# Patient Record
Sex: Female | Born: 2000 | Race: White | Hispanic: No | Marital: Single | Smoking: Never smoker
Health system: Southern US, Community
[De-identification: ages and names within clinical notes are randomized; demographics above are authoritative.]

## PROBLEM LIST (undated history)

## (undated) DIAGNOSIS — M419 Scoliosis, unspecified: Secondary | ICD-10-CM

## (undated) DIAGNOSIS — G8929 Other chronic pain: Secondary | ICD-10-CM

## (undated) DIAGNOSIS — M549 Dorsalgia, unspecified: Secondary | ICD-10-CM

## (undated) DIAGNOSIS — E119 Type 2 diabetes mellitus without complications: Secondary | ICD-10-CM

---
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
## 2019-11-05 IMAGING — US US ABDOMEN LIMITED
1 series · 14 of 25 positions shown · non-contrast
Comparison: None.

CLINICAL DATA: Right upper quadrant pain, vomiting, and nausea.

EXAM:
ULTRASOUND ABDOMEN LIMITED RIGHT UPPER QUADRANT

[Series 1: us abdomen limited · 0.22mm/px · 14 of 52 slices shown]
[im 1/52]
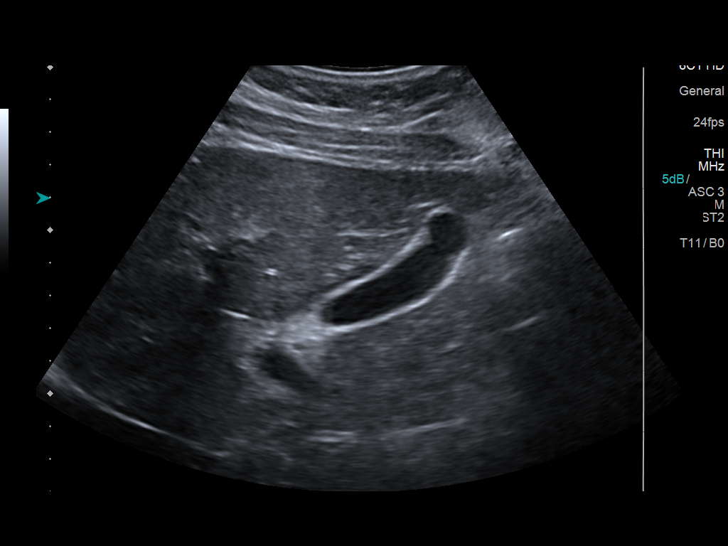
[im 5/52]
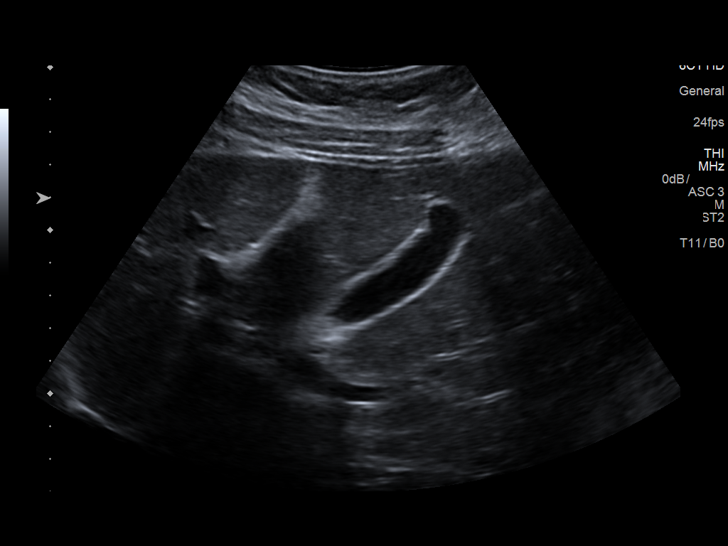
[im 9/52]
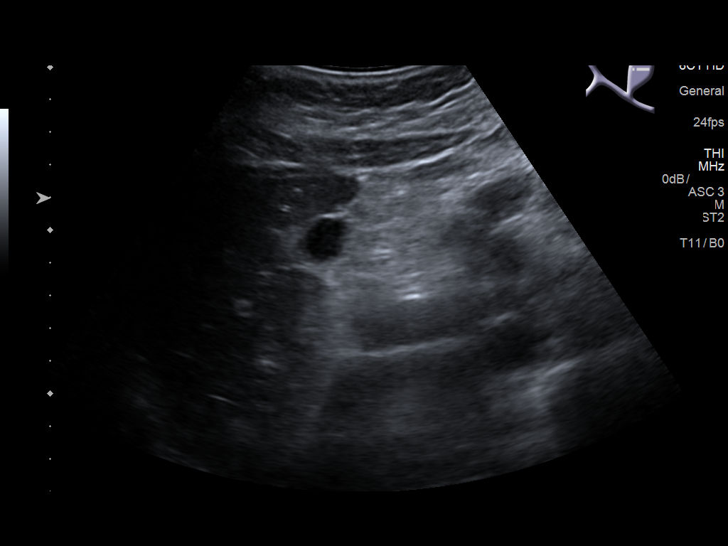
[im 13/52]
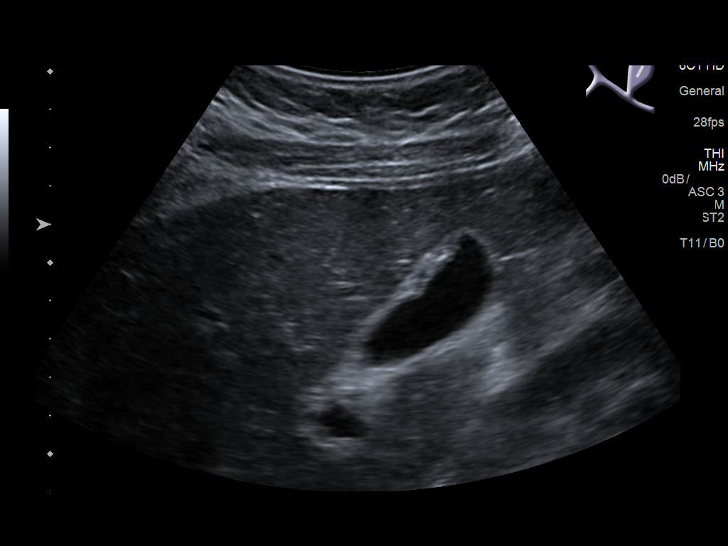
[im 18/52]
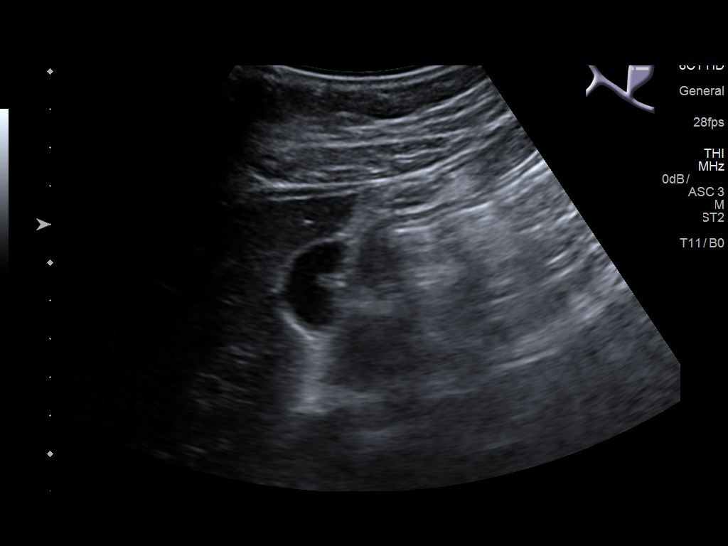
[im 20/52]
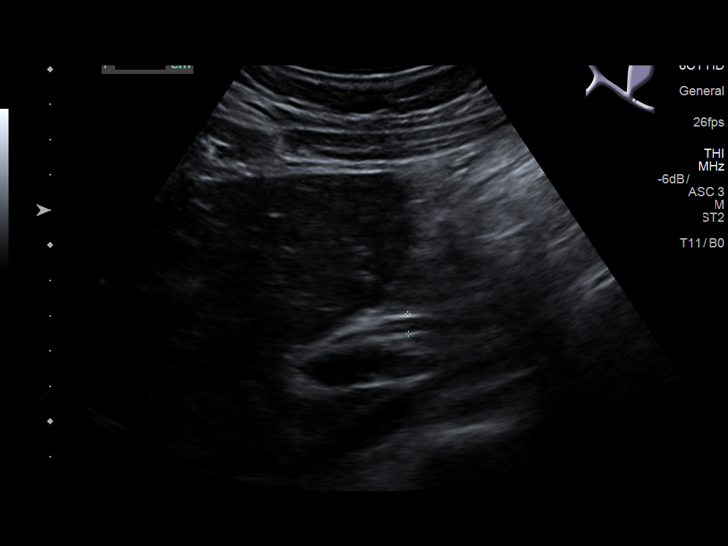
[im 24/52]
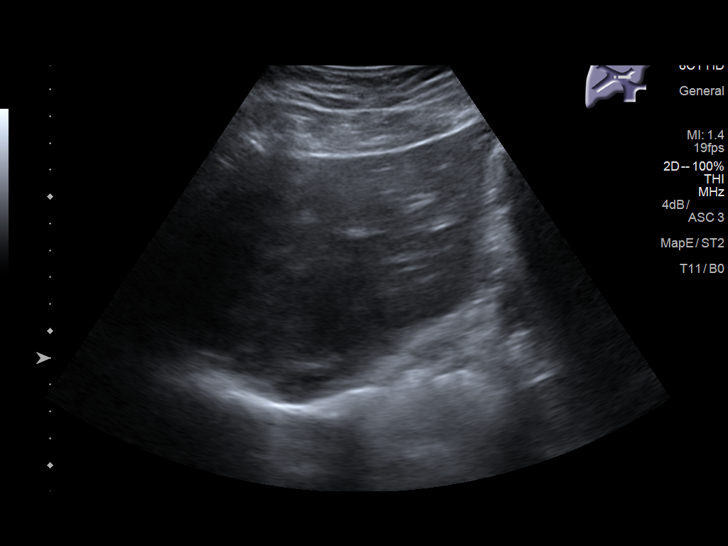
[im 28/52]
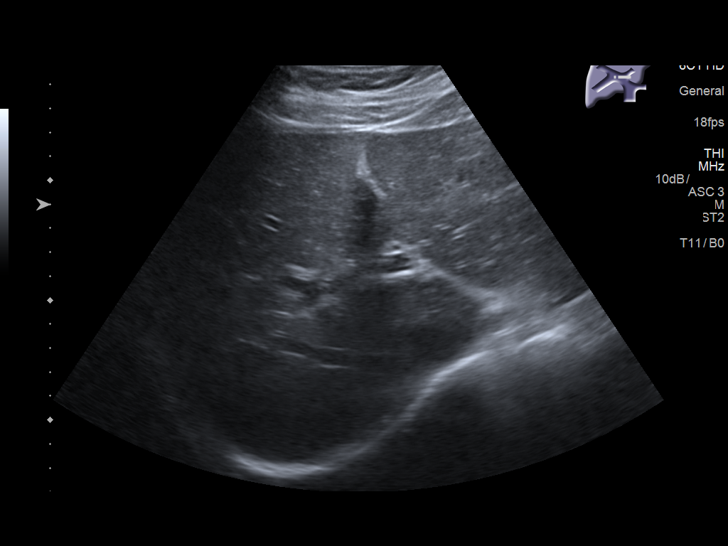
[im 32/52]
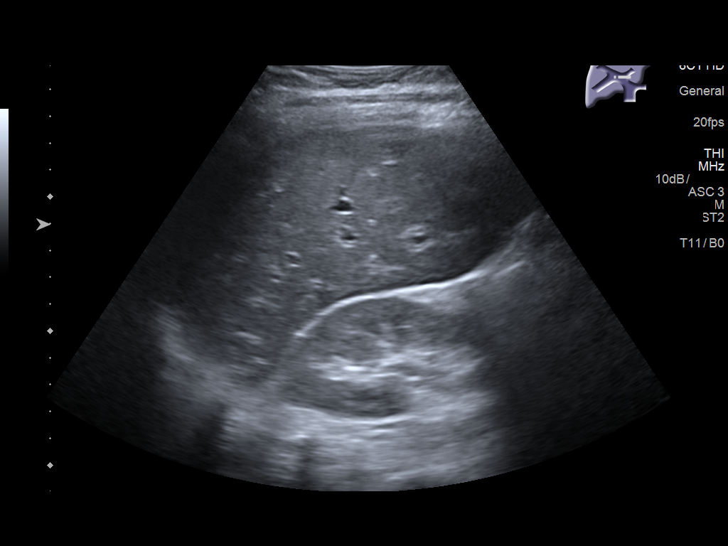
[im 35/52]
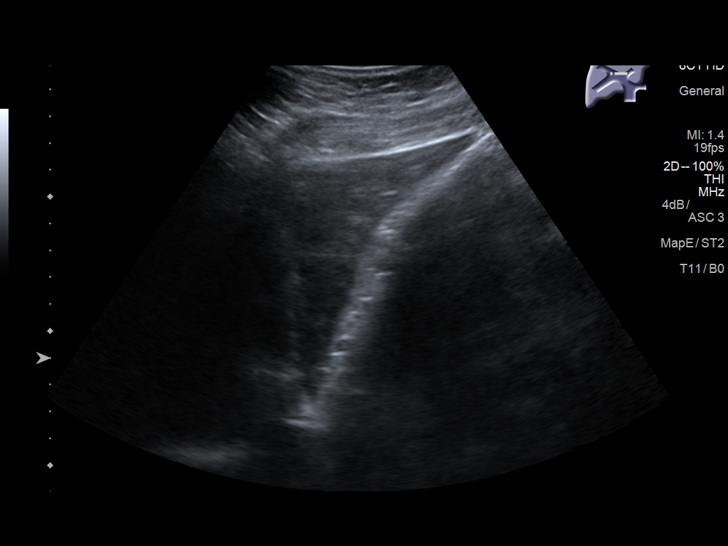
[im 39/52]
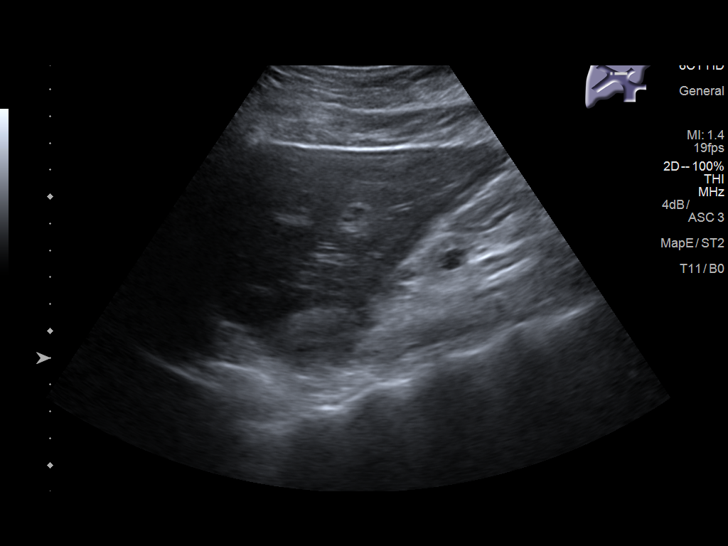
[im 43/52]
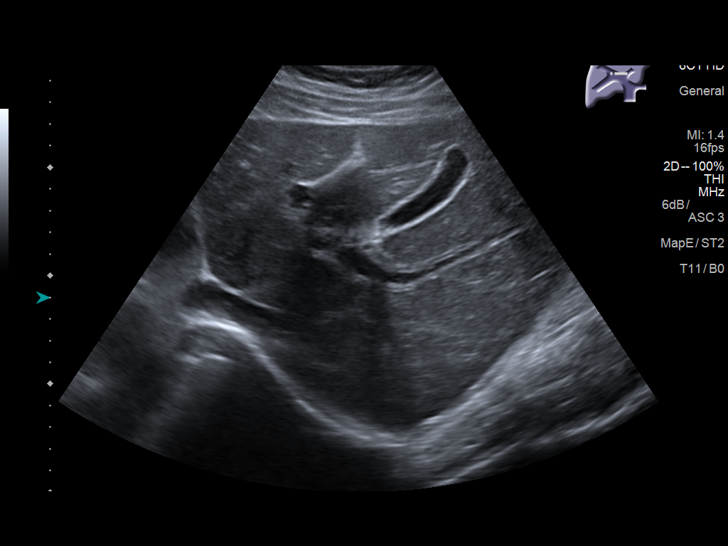
[im 47/52]
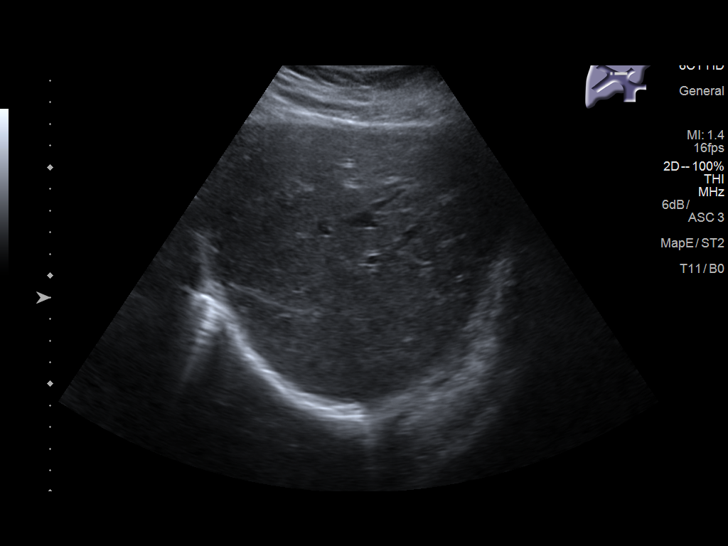
[im 52/52]
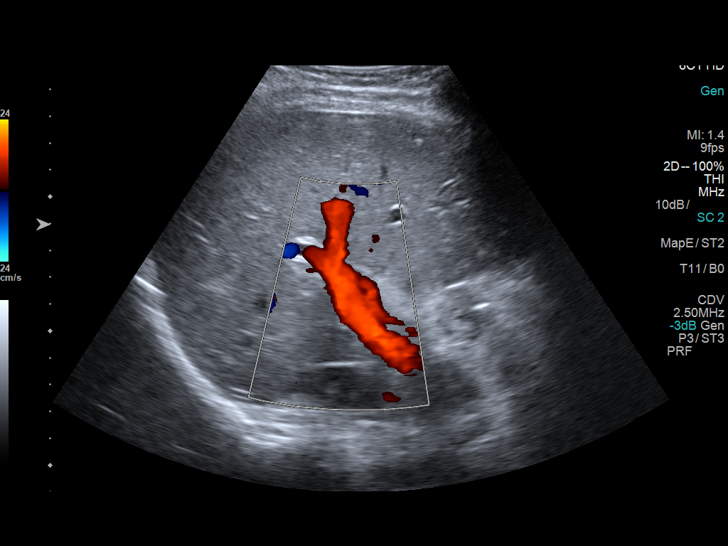

[14 of 25 positions shown; findings below may reference images not displayed]

FINDINGS: Gallbladder:

No gallstones. Gallbladder wall is mildly thickened. This is likely
due to partial contraction. This represents normal variation if the
patient is nonfasting. Murphy's sign is negative.

Common bile duct:

Diameter: 5.7 mm, normal

Liver:

No focal lesion identified. Within normal limits in parenchymal
echogenicity. Portal vein is patent on color Doppler imaging with
normal direction of blood flow towards the liver.
IMPRESSION: Partially contracted gallbladder likely accounts for mild
gallbladder wall thickening. No gallstones. Negative Murphy's sign.

## 2019-12-13 ENCOUNTER — Ambulatory Visit: Payer: Commercial Managed Care - PPO | Admitting: Endocrinology

## 2019-12-13 ENCOUNTER — Other Ambulatory Visit: Payer: Self-pay

## 2019-12-13 ENCOUNTER — Other Ambulatory Visit: Payer: Self-pay | Admitting: Endocrinology

## 2019-12-13 ENCOUNTER — Encounter: Payer: Self-pay | Admitting: Endocrinology

## 2019-12-13 VITALS — BP 115/70 | HR 72 | Ht 62.0 in | Wt 114.2 lb

## 2019-12-13 DIAGNOSIS — E10649 Type 1 diabetes mellitus with hypoglycemia without coma: Secondary | ICD-10-CM

## 2019-12-13 DIAGNOSIS — E1369 Other specified diabetes mellitus with other specified complication: Secondary | ICD-10-CM

## 2019-12-13 DIAGNOSIS — E109 Type 1 diabetes mellitus without complications: Secondary | ICD-10-CM

## 2019-12-13 LAB — POCT GLYCOSYLATED HEMOGLOBIN (HGB A1C): Hemoglobin A1C: 6.9 % — AB (ref 4.0–5.6)

## 2019-12-13 MED ORDER — INSULIN LISPRO 100 UNIT/ML ~~LOC~~ SOLN: SUBCUTANEOUS | 3 refills | Status: DC

## 2019-12-13 NOTE — Progress Notes (Addendum)
Subjective:    Patient ID: Janet Little, female    DOB: 11-25-2000, 19 y.o.   MRN: [id number]  HPI pt is referred by Dr Maurice March, for diabetes.  Pt states DM was dx'ed in 2015; she is unaware of any chronic complications; he has been on insulin since dx, and pump rx since 2017; pt says his diet and exercise are good; she has never had GDM, pancreatitis, pancreatic surgery, severe hypoglycemia or DKA (but she had nonketotic hyperosmolar hyperglycemic state at dx).  She has mild hypoglycemia approx 3-4 times per week.  This happens when she has decreased appetite.  She uses Omnipods (not "Dash") and Dexcom G6.   She takes these settings: basal rates: 0.65 units/HR 08:30  0.8 units/HR 5PM 1 unit/HR bolus of 1 unit/8grams carbohydrate correction bolus (which some people call "sensitivity," or "insulin sensitivity ratio," or just "isr") of 1 unit for each 45 by which your glucose exceeds 150.   I reviewed continuous glucose monitor data.  Glucoses vary widely.  Glucose varies from 60-300.  It is in general highest at 1PM.  It generally decreases overnight.  She takes 1.4 boluses per day.  TDD is 20 units (90% basal).   Past Medical History:  Diagnosis Date  . Chronic back pain   . Diabetes mellitus without complication (HCC)   . Scoliosis     No past surgical history on file.  Social History   Socioeconomic History  . Marital status: Single    Spouse name: Not on file  . Number of children: Not on file  . Years of education: Not on file  . Highest education level: Not on file  Occupational History  . Not on file  Tobacco Use  . Smoking status: Never Smoker  . Smokeless tobacco: Never Used  Substance and Sexual Activity  . Alcohol use: Yes    Comment: 7 drinks on Friday and 7 on Saturdays   . Drug use: Yes    Types: Marijuana  . Sexual activity: Not on file  Other Topics Concern  . Not on file  Social History Narrative  . Not on file   Social Determinants of  Health   Financial Resource Strain:   . Difficulty of Paying Living Expenses: Not on file  Food Insecurity:   . Worried About Programme researcher, broadcasting/film/video in the Last Year: Not on file  . Ran Out of Food in the Last Year: Not on file  Transportation Needs:   . Lack of Transportation (Medical): Not on file  . Lack of Transportation (Non-Medical): Not on file  Physical Activity:   . Days of Exercise per Week: Not on file  . Minutes of Exercise per Session: Not on file  Stress:   . Feeling of Stress : Not on file  Social Connections:   . Frequency of Communication with Friends and Family: Not on file  . Frequency of Social Gatherings with Friends and Family: Not on file  . Attends Religious Services: Not on file  . Active Member of Clubs or Organizations: Not on file  . Attends Banker Meetings: Not on file  . Marital Status: Not on file  Intimate Partner Violence:   . Fear of Current or Ex-Partner: Not on file  . Emotionally Abused: Not on file  . Physically Abused: Not on file  . Sexually Abused: Not on file    Current Outpatient Medications on File Prior to Visit  Medication Sig Dispense Refill  .  fluticasone (FLONASE) 50 MCG/ACT nasal spray Place 2 sprays into both nostrils daily. For allergies, nasal congestion 16 g 2  . loratadine (CLARITIN) 10 MG tablet Take 1 tablet (10 mg total) by mouth daily. In mornings for allergies 30 tablet 1  . pantoprazole (PROTONIX) 40 MG tablet Take 1 tablet (40 mg total) by mouth daily. (Patient not taking: Reported on 12/13/2019) 30 tablet 2  . SUMAtriptan (IMITREX) 50 MG tablet Take 1 tablet (50 mg total) by mouth once as needed for migraine. May repeat in 2 hours if headache persists or recurs. 30 tablet 0   No current facility-administered medications on file prior to visit.    No Known Allergies  Family History  Problem Relation Age of Onset  . Diabetes Neg Hx     BP 115/70   Pulse 72   Ht 5\' 2"  (1.575 m)   Wt 114 lb 3.2 oz  (51.8 kg)   SpO2 99%   BMI 20.89 kg/m    Review of Systems denies weight loss, blurry vision, sob, n/v, and urinary frequency.  depression is improved recently.       Objective:   Physical Exam VITAL SIGNS:  See vs page GENERAL: no distress Pulses: dorsalis pedis intact bilat.   MSK: no deformity of the feet CV: no leg edema Skin:  no ulcer on the feet.  normal color and temp on the feet. Neuro: sensation is intact to touch on the feet  Lab Results  Component Value Date   HGBA1C 6.9 (A) 12/13/2019     I have reviewed outside records, and summarized: Pt was noted to have DM, and referred here.  Pt reported n/v, and various causes were considered.     Assessment & Plan:  Type 1 DM, new to me Hypoglycemia, due to insulin: this limits aggressiveness of glycemic control   Patient Instructions  good diet and exercise significantly improve the control of your diabetes.  please let me know if you wish to be referred to a dietician.  high blood sugar is very risky to your health.  you should see an eye doctor and dentist every year.  It is very important to get all recommended vaccinations.  Controlling your blood pressure and cholesterol drastically reduces the damage diabetes does to your body.  Those who smoke should quit.  Please discuss these with your doctor.  Please take these boluses: MN: 0.5 units/HR 9AM: 0.8 units/HR 5PM: 1.1 units/HR Please come back for a follow-up appointment in 3 months.

## 2019-12-13 NOTE — Patient Instructions (Addendum)
good diet and exercise significantly improve the control of your diabetes.  please let me know if you wish to be referred to a dietician.  high blood sugar is very risky to your health.  you should see an eye doctor and dentist every year.  It is very important to get all recommended vaccinations.  Controlling your blood pressure and cholesterol drastically reduces the damage diabetes does to your body.  Those who smoke should quit.  Please discuss these with your doctor.  Please take these boluses: MN: 0.5 units/HR 9AM: 0.8 units/HR 5PM: 1.1 units/HR Please come back for a follow-up appointment in 3 months.

## 2019-12-14 NOTE — Telephone Encounter (Signed)
Please advise 

## 2019-12-19 ENCOUNTER — Telehealth: Payer: Self-pay

## 2019-12-19 NOTE — Telephone Encounter (Signed)
New message     Pt c/o medication issue:  1. Name of Medication: insulin lispro (HUMALOG) 100 UNIT/ML injection  2. What is your medication issue? Insurance will not cover medication - please advise     Asking for a call back to discuss

## 2019-12-20 NOTE — Telephone Encounter (Signed)
Please advise 

## 2019-12-20 NOTE — Telephone Encounter (Signed)
novolog is OK same dosage

## 2019-12-21 ENCOUNTER — Other Ambulatory Visit: Payer: Self-pay

## 2019-12-21 DIAGNOSIS — E1369 Other specified diabetes mellitus with other specified complication: Secondary | ICD-10-CM

## 2019-12-21 MED ORDER — INSULIN ASPART 100 UNIT/ML ~~LOC~~ SOLN: SUBCUTANEOUS | 99 refills | Status: AC

## 2019-12-21 NOTE — Telephone Encounter (Signed)
Rx for NovoLog 100 units/ML sent to patients preferred pharmacy.

## 2020-01-09 IMAGING — NM NM HEPATOBILIARY IMAGE, INC GB
2 series · 12 of 12 positions shown · non-contrast
Comparison: None.

CLINICAL DATA: Evaluate for cholecystitis.

EXAM:
NUCLEAR MEDICINE HEPATOBILIARY IMAGING
TECHNIQUE: Sequential images of the abdomen were obtained [DATE] minutes
following intravenous administration of radiopharmaceutical.
RADIOPHARMACEUTICALS:  4.65 mCi Fc-99m  Choletec IV

[he hepatobiliary · 3.10mm/px · 6 of 13 frames shown (1 of 2)]
[frame 2/13]
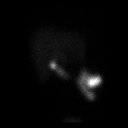
[frame 4/13]
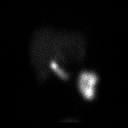
[frame 6/13]
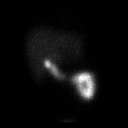
[frame 8/13]
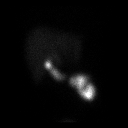
[frame 10/13]
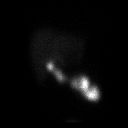
[frame 12/13]
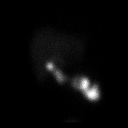

[he hepatobiliary · 3.10mm/px · 6 of 60 frames shown (2 of 2)]
[frame 6/60]
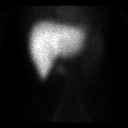
[frame 16/60]
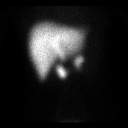
[frame 26/60]
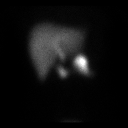
[frame 36/60]
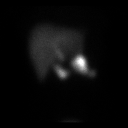
[frame 46/60]
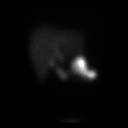
[frame 56/60]
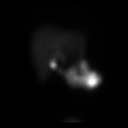

[12 of 12 positions shown; findings below may reference images not displayed]

FINDINGS: Prompt uptake and biliary excretion of activity by the liver is
seen. Gallbladder activity is visualized, consistent with patency of
cystic duct. Biliary activity passes into small bowel, consistent
with patent common bile duct.
IMPRESSION: Normal gallbladder filling.  No evidence of cholecystitis.

## 2020-03-15 ENCOUNTER — Ambulatory Visit: Payer: Commercial Managed Care - PPO | Admitting: Endocrinology

## 2020-03-22 ENCOUNTER — Other Ambulatory Visit: Payer: Self-pay

## 2020-03-26 ENCOUNTER — Other Ambulatory Visit: Payer: Self-pay

## 2020-03-26 ENCOUNTER — Ambulatory Visit: Payer: Commercial Managed Care - PPO | Admitting: Endocrinology

## 2020-03-26 ENCOUNTER — Encounter: Payer: Self-pay | Admitting: Endocrinology

## 2020-03-26 VITALS — BP 90/60 | HR 54 | Ht 62.0 in | Wt 113.8 lb

## 2020-03-26 DIAGNOSIS — E109 Type 1 diabetes mellitus without complications: Secondary | ICD-10-CM | POA: Diagnosis not present

## 2020-03-26 LAB — CBC WITH DIFFERENTIAL/PLATELET
Basophils Absolute: 0 10*3/uL (ref 0.0–0.1)
Basophils Relative: 0.5 % (ref 0.0–3.0)
Eosinophils Absolute: 0.1 10*3/uL (ref 0.0–0.7)
Eosinophils Relative: 1.2 % (ref 0.0–5.0)
HCT: 38.8 % (ref 36.0–46.0)
Hemoglobin: 13.1 g/dL (ref 12.0–15.0)
Lymphocytes Relative: 38 % (ref 12.0–46.0)
Lymphs Abs: 2.7 10*3/uL (ref 0.7–4.0)
MCHC: 33.6 g/dL (ref 30.0–36.0)
MCV: 88.4 fl (ref 78.0–100.0)
Monocytes Absolute: 0.6 10*3/uL (ref 0.1–1.0)
Monocytes Relative: 8.5 % (ref 3.0–12.0)
Neutro Abs: 3.7 10*3/uL (ref 1.4–7.7)
Neutrophils Relative %: 51.8 % (ref 43.0–77.0)
Platelets: 218 10*3/uL (ref 150.0–400.0)
RBC: 4.4 Mil/uL (ref 3.87–5.11)
RDW: 13.3 % (ref 11.5–15.5)
WBC: 7.1 10*3/uL (ref 4.0–10.5)

## 2020-03-26 LAB — BASIC METABOLIC PANEL
BUN: 9 mg/dL (ref 6–23)
CO2: 27 mEq/L (ref 19–32)
Calcium: 10.2 mg/dL (ref 8.4–10.5)
Chloride: 104 mEq/L (ref 96–112)
Creatinine, Ser: 0.69 mg/dL (ref 0.40–1.20)
GFR: 123.79 mL/min (ref >60.00)
Glucose, Bld: 57 mg/dL — ABNORMAL LOW (ref 70–99)
Potassium: 4.2 mEq/L (ref 3.5–5.1)
Sodium: 137 mEq/L (ref 135–145)

## 2020-03-26 LAB — POCT GLYCOSYLATED HEMOGLOBIN (HGB A1C): Hemoglobin A1C: 6.9 % — AB (ref 4.0–5.6)

## 2020-03-26 LAB — LIPID PANEL
Cholesterol: 125 mg/dL (ref 0–200)
HDL: 55.3 mg/dL (ref >39.00)
LDL Cholesterol: 62 mg/dL (ref 0–99)
NonHDL: 69.48
Total CHOL/HDL Ratio: 2
Triglycerides: 36 mg/dL (ref 0.0–149.0)
VLDL: 7.2 mg/dL (ref 0.0–40.0)

## 2020-03-26 LAB — TSH: TSH: 1.83 u[IU]/mL (ref 0.35–4.50)

## 2020-03-26 NOTE — Progress Notes (Signed)
Subjective:    Patient ID: Janet Little, female    DOB: 2000/11/19, 20 y.o.   MRN: [id number]  HPI Pt returns for f/u of diabetes mellitus: DM type: 1 Dx'ed: 2015 Complications: none Therapy: insulin since dx GDM: never (but she had nonketotic hyperosmolar hyperglycemic state at dx) DKA: never Severe hypoglycemia: never Pancreatitis: never Pancreatic imaging (2019 CT): Subtle hazy opacity is seen adjacent to the pancreatic head and descending duodenum. This could be due to mild duodenitis or focal pancreatitis. SDOH: none Other: She uses Omnipods (not "Dash") and Dexcom G6. Interval history:    She takes these settings: basal rates: MN: 0.5 units/HR 9AM: 0.8 units/HR 5PM: 1.1 units/HR bolus of 1 unit/8grams carbohydrate correction bolus (which some people call "sensitivity," or "insulin sensitivity ratio," or just "isr") of 1 unit for each 45 by which your glucose exceeds 150.   I reviewed continuous glucose monitor data.  Glucoses vary widely at all times of day.  Glucose varies from 60-290.  There is no trend throughout the day, but most hypoglycemia is at night.  She takes 2.9 boluses per day.  TDD is 24 units (75% basal).   Past Medical History:  Diagnosis Date  . Chronic back pain   . Diabetes mellitus without complication (HCC)   . Scoliosis     No past surgical history on file.  Social History   Socioeconomic History  . Marital status: Single    Spouse name: Not on file  . Number of children: Not on file  . Years of education: Not on file  . Highest education level: Not on file  Occupational History  . Not on file  Tobacco Use  . Smoking status: Never Smoker  . Smokeless tobacco: Never Used  Substance and Sexual Activity  . Alcohol use: Yes    Comment: 7 drinks on Friday and 7 on Saturdays   . Drug use: Yes    Types: Marijuana  . Sexual activity: Not on file  Other Topics Concern  . Not on file  Social History Narrative  . Not on file    Social Determinants of Health   Financial Resource Strain: Not on file  Food Insecurity: Not on file  Transportation Needs: Not on file  Physical Activity: Not on file  Stress: Not on file  Social Connections: Not on file  Intimate Partner Violence: Not on file    Current Outpatient Medications on File Prior to Visit  Medication Sig Dispense Refill  . fluticasone (FLONASE) 50 MCG/ACT nasal spray Place 2 sprays into both nostrils daily. For allergies, nasal congestion 16 g 2  . insulin aspart (NOVOLOG) 100 UNIT/ML injection For use in Pump, total of 40 units per day 90 mL PRN  . loratadine (CLARITIN) 10 MG tablet Take 1 tablet (10 mg total) by mouth daily. In mornings for allergies 30 tablet 1  . pantoprazole (PROTONIX) 40 MG tablet Take 1 tablet (40 mg total) by mouth daily. 30 tablet 2  . SUMAtriptan (IMITREX) 50 MG tablet Take 1 tablet (50 mg total) by mouth once as needed for migraine. May repeat in 2 hours if headache persists or recurs. 30 tablet 0   No current facility-administered medications on file prior to visit.    No Known Allergies  Family History  Problem Relation Age of Onset  . Diabetes Neg Hx     BP 90/60 (BP Location: Right Arm, Patient Position: Sitting, Cuff Size: Normal)   Pulse (!) 54   Ht 5'  2" (1.575 m)   Wt 113 lb 12.8 oz (51.6 kg)   SpO2 96%   BMI 20.81 kg/m    Review of Systems     Objective:   Physical Exam VITAL SIGNS:  See vs page GENERAL: no distress Pulses: dorsalis pedis intact bilat.   MSK: no deformity of the feet CV: no leg edema Skin:  no ulcer on the feet.  normal color and temp on the feet. Neuro: sensation is intact to touch on the feet   Lab Results  Component Value Date   HGBA1C 6.9 (A) 03/26/2020       Assessment & Plan:  Type 1 DM Hypoglycemia, due to insulin: this limits aggressiveness of glycemic control   Patient Instructions  Please take these settings: basal rates: MN: 0.4 units/HR 9AM: 0.8  units/HR 5PM: 1.1 units/HR bolus of 1 unit/8grams carbohydrate correction bolus (which some people call "sensitivity," or "insulin sensitivity ratio," or just "isr") of 1 unit for each 45 by which your glucose exceeds 150.   Blood tests are requested for you today.  We'll let you know about the results.  Please come back for a follow-up appointment in 3 months.

## 2020-03-26 NOTE — Patient Instructions (Addendum)
Please take these settings: basal rates: MN: 0.4 units/HR 9AM: 0.8 units/HR 5PM: 1.1 units/HR bolus of 1 unit/8grams carbohydrate correction bolus (which some people call "sensitivity," or "insulin sensitivity ratio," or just "isr") of 1 unit for each 45 by which your glucose exceeds 150.   Blood tests are requested for you today.  We'll let you know about the results.  Please come back for a follow-up appointment in 3 months.

## 2020-05-30 ENCOUNTER — Ambulatory Visit: Payer: Commercial Managed Care - PPO | Admitting: Endocrinology
